# Patient Record
Sex: Female | Born: 1965 | Race: Black or African American | Hispanic: No | Marital: Single | Smoking: Never smoker
Health system: Southern US, Community
[De-identification: ages and names within clinical notes are randomized; demographics above are authoritative.]

## PROBLEM LIST (undated history)

## (undated) VITALS — BP 101/99 | HR 76 | Temp 97.8°F | Resp 20 | Ht 66.0 in | Wt 209.0 lb

## (undated) DIAGNOSIS — Z789 Other specified health status: Secondary | ICD-10-CM

## (undated) DIAGNOSIS — F329 Major depressive disorder, single episode, unspecified: Secondary | ICD-10-CM

## (undated) DIAGNOSIS — I10 Essential (primary) hypertension: Secondary | ICD-10-CM

## (undated) DIAGNOSIS — F32A Depression, unspecified: Secondary | ICD-10-CM

## (undated) DIAGNOSIS — F419 Anxiety disorder, unspecified: Secondary | ICD-10-CM

## (undated) HISTORY — PX: LEEP: SHX91

---

## 2000-10-26 ENCOUNTER — Emergency Department (HOSPITAL_COMMUNITY): Admission: EM | Admit: 2000-10-26 | Discharge: 2000-10-26 | Payer: Self-pay | Admitting: Emergency Medicine

## 2000-11-06 ENCOUNTER — Emergency Department (HOSPITAL_COMMUNITY): Admission: EM | Admit: 2000-11-06 | Discharge: 2000-11-06 | Payer: Self-pay | Admitting: *Deleted

## 2001-05-21 ENCOUNTER — Other Ambulatory Visit: Admission: RE | Admit: 2001-05-21 | Discharge: 2001-05-21 | Payer: Self-pay

## 2001-05-27 ENCOUNTER — Encounter: Payer: Self-pay | Admitting: Rheumatology

## 2001-05-27 ENCOUNTER — Encounter: Admission: RE | Admit: 2001-05-27 | Discharge: 2001-05-27 | Payer: Self-pay | Admitting: Rheumatology

## 2001-09-07 ENCOUNTER — Emergency Department (HOSPITAL_COMMUNITY): Admission: EM | Admit: 2001-09-07 | Discharge: 2001-09-07 | Payer: Self-pay | Admitting: *Deleted

## 2002-12-15 ENCOUNTER — Encounter: Admission: RE | Admit: 2002-12-15 | Discharge: 2002-12-15 | Payer: Self-pay

## 2006-08-17 ENCOUNTER — Other Ambulatory Visit: Admission: RE | Admit: 2006-08-17 | Discharge: 2006-08-17 | Payer: Self-pay | Admitting: *Deleted

## 2006-09-04 ENCOUNTER — Encounter: Admission: RE | Admit: 2006-09-04 | Discharge: 2006-09-04 | Payer: Self-pay | Admitting: *Deleted

## 2006-09-27 ENCOUNTER — Emergency Department (HOSPITAL_COMMUNITY): Admission: EM | Admit: 2006-09-27 | Discharge: 2006-09-27 | Payer: Self-pay | Admitting: *Deleted

## 2006-12-10 ENCOUNTER — Encounter: Admission: RE | Admit: 2006-12-10 | Discharge: 2006-12-10 | Payer: Self-pay | Admitting: *Deleted

## 2007-01-21 ENCOUNTER — Ambulatory Visit (HOSPITAL_COMMUNITY): Admission: RE | Admit: 2007-01-21 | Discharge: 2007-01-21 | Payer: Self-pay | Admitting: Obstetrics & Gynecology

## 2007-09-06 ENCOUNTER — Encounter: Admission: RE | Admit: 2007-09-06 | Discharge: 2007-09-06 | Payer: Self-pay | Admitting: Obstetrics & Gynecology

## 2007-10-16 ENCOUNTER — Ambulatory Visit (HOSPITAL_COMMUNITY): Admission: RE | Admit: 2007-10-16 | Discharge: 2007-10-16 | Payer: Self-pay | Admitting: Obstetrics & Gynecology

## 2009-09-04 ENCOUNTER — Emergency Department (HOSPITAL_COMMUNITY): Admission: EM | Admit: 2009-09-04 | Discharge: 2009-09-04 | Payer: Self-pay | Admitting: Emergency Medicine

## 2012-11-01 ENCOUNTER — Encounter: Payer: Self-pay | Admitting: Medical

## 2012-11-28 ENCOUNTER — Encounter: Payer: Self-pay | Admitting: Obstetrics & Gynecology

## 2012-12-18 ENCOUNTER — Encounter: Payer: Self-pay | Admitting: Obstetrics & Gynecology

## 2013-04-07 ENCOUNTER — Telehealth (HOSPITAL_COMMUNITY): Payer: Self-pay | Admitting: *Deleted

## 2013-04-07 NOTE — Telephone Encounter (Signed)
Patient contacted me to reschedule her appointment at the [facility] due to her menstrual cycle stopping 7 years ago. Patient has cancelled two appointments and no showed one appointment. Gave her the phone number to the Clinics to reschedule. Patient verbalized understanding and will call to reschedule.

## 2013-09-19 ENCOUNTER — Inpatient Hospital Stay (HOSPITAL_COMMUNITY)
Admission: AD | Admit: 2013-09-19 | Discharge: 2013-09-23 | DRG: 885 | Disposition: A | Discharge status: Home / Self Care | Payer: Federal, State, Local not specified - Other | Attending: Psychiatry | Admitting: Psychiatry

## 2013-09-19 ENCOUNTER — Encounter (HOSPITAL_COMMUNITY): Payer: Self-pay | Admitting: Licensed Clinical Social Worker

## 2013-09-19 DIAGNOSIS — F32A Depression, unspecified: Secondary | ICD-10-CM | POA: Diagnosis present

## 2013-09-19 DIAGNOSIS — R45851 Suicidal ideations: Secondary | ICD-10-CM

## 2013-09-19 DIAGNOSIS — F329 Major depressive disorder, single episode, unspecified: Principal | ICD-10-CM

## 2013-09-19 HISTORY — DX: Anxiety disorder, unspecified: F41.9

## 2013-09-19 HISTORY — DX: Major depressive disorder, single episode, unspecified: F32.9

## 2013-09-19 HISTORY — DX: Depression, unspecified: F32.A

## 2013-09-19 HISTORY — DX: Other specified health status: Z78.9

## 2013-09-19 MED ORDER — MAGNESIUM HYDROXIDE 400 MG/5ML PO SUSP: 30.0000 mL | Freq: Every day | ORAL | Status: DC | PRN

## 2013-09-19 MED ORDER — ALUM & MAG HYDROXIDE-SIMETH 200-200-20 MG/5ML PO SUSP: 30.0000 mL | ORAL | Status: DC | PRN

## 2013-09-19 MED ORDER — HYDROXYZINE HCL 50 MG PO TABS: 50.0000 mg | ORAL_TABLET | Freq: Every evening | ORAL | Status: DC | PRN
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
## 2013-09-19 NOTE — Progress Notes (Signed)
Report received from B. McNichols RN after admission. Writer met with patient shortly after she was oriented to the unit. She requested to use the phone before lying down and writer informed her of medication available if needed and she declined.  She is waiting to use the phone when it becomes available. She returned to her room to wait. She voiced no concerns currently. Safety maintained on unit with 15 min checks

## 2013-09-20 ENCOUNTER — Encounter (HOSPITAL_COMMUNITY): Payer: Self-pay | Admitting: *Deleted

## 2013-09-20 DIAGNOSIS — F329 Major depressive disorder, single episode, unspecified: Principal | ICD-10-CM | POA: Diagnosis present

## 2013-09-20 DIAGNOSIS — R45851 Suicidal ideations: Secondary | ICD-10-CM

## 2013-09-20 DIAGNOSIS — F332 Major depressive disorder, recurrent severe without psychotic features: Secondary | ICD-10-CM

## 2013-09-20 LAB — URINALYSIS, ROUTINE W REFLEX MICROSCOPIC
Bilirubin Urine: NEGATIVE
Glucose, UA: NEGATIVE mg/dL
Ketones, ur: NEGATIVE mg/dL
LEUKOCYTES UA: NEGATIVE
NITRITE: NEGATIVE
PH: 6.5 (ref 5.0–8.0)
Protein, ur: NEGATIVE mg/dL
SPECIFIC GRAVITY, URINE: 1.014 (ref 1.005–1.030)
Urobilinogen, UA: 0.2 mg/dL (ref 0.0–1.0)

## 2013-09-20 LAB — COMPREHENSIVE METABOLIC PANEL
ALBUMIN: 3.7 g/dL (ref 3.5–5.2)
ALT: 15 U/L (ref 0–35)
AST: 16 U/L (ref 0–37)
Alkaline Phosphatase: 103 U/L (ref 39–117)
BUN: 10 mg/dL (ref 6–23)
CO2: 29 mEq/L (ref 19–32)
CREATININE: 0.79 mg/dL (ref 0.50–1.10)
Calcium: 9.2 mg/dL (ref 8.4–10.5)
Chloride: 105 mEq/L (ref 96–112)
GFR calc Af Amer: >90 mL/min (ref >90)
GFR calc non Af Amer: >90 mL/min (ref >90)
Glucose, Bld: 90 mg/dL (ref 70–99)
POTASSIUM: 3.6 meq/L — AB (ref 3.7–5.3)
Sodium: 143 mEq/L (ref 137–147)
TOTAL PROTEIN: 7.9 g/dL (ref 6.0–8.3)
Total Bilirubin: 0.4 mg/dL (ref 0.3–1.2)

## 2013-09-20 LAB — CBC
HEMATOCRIT: 42 % (ref 36.0–46.0)
Hemoglobin: 13.7 g/dL (ref 12.0–15.0)
MCH: 30.9 pg (ref 26.0–34.0)
MCHC: 32.6 g/dL (ref 30.0–36.0)
MCV: 94.8 fL (ref 78.0–100.0)
Platelets: 226 10*3/uL (ref 150–400)
RBC: 4.43 MIL/uL (ref 3.87–5.11)
RDW: 12.9 % (ref 11.5–15.5)
WBC: 7 10*3/uL (ref 4.0–10.5)

## 2013-09-20 LAB — RAPID URINE DRUG SCREEN, HOSP PERFORMED
Amphetamines: NOT DETECTED
BARBITURATES: NOT DETECTED
Benzodiazepines: NOT DETECTED
COCAINE: NOT DETECTED
OPIATES: NOT DETECTED
Tetrahydrocannabinol: NOT DETECTED

## 2013-09-20 LAB — URINE MICROSCOPIC-ADD ON

## 2013-09-20 LAB — PREGNANCY, URINE: Preg Test, Ur: NEGATIVE

## 2013-09-20 LAB — TSH: TSH: 2.688 u[IU]/mL (ref 0.350–4.500)

## 2013-09-20 MED ORDER — FLUOXETINE HCL 20 MG PO CAPS
20.0000 mg | ORAL_CAPSULE | Freq: Every day | ORAL | Status: DC
  Filled 2013-09-20 (×4): qty 1

## 2013-09-20 MED ORDER — TRAZODONE HCL 50 MG PO TABS: 50.0000 mg | ORAL_TABLET | Freq: Every evening | ORAL | Status: DC | PRN

## 2013-09-20 NOTE — [facility] Suicide Risk Assessment (Signed)
   Nursing information obtained from:    Demographic factors:    Current Mental Status:    Loss Factors:    Historical Factors:    Risk Reduction Factors:    Total Time spent with patient: 20 minutes  CLINICAL FACTORS:   Severe Anxiety and/or Agitation Depression:   Anhedonia Hopelessness Impulsivity Insomnia  Psychiatric Specialty Exam: Physical Exam  Psychiatric: Her speech is normal and behavior is normal. Judgment normal. Her mood appears anxious. Cognition and memory are normal. She exhibits a depressed mood. She expresses suicidal ideation.    Review of Systems  Constitutional: Negative.   HENT: Negative.   Eyes: Negative.   Respiratory: Negative.   Cardiovascular: Negative.   Gastrointestinal: Negative.   Genitourinary: Negative.   Musculoskeletal: Negative.   Skin: Negative.   Neurological: Negative.   Endo/Heme/Allergies: Negative.   Psychiatric/Behavioral: Positive for depression and suicidal ideas. Negative for hallucinations and substance abuse. The patient is nervous/anxious and has insomnia.     Blood pressure 121/84, pulse 80, temperature 97.9 F (36.6 C), temperature source Oral, resp. rate 18, height 5\' 6"  (1.676 m), weight 94.802 kg (209 lb).Body mass index is 33.75 kg/(m^2).  General Appearance: Fairly Groomed  Patent attorneyye Contact::  Good  Speech:  Clear and Coherent and Normal Rate  Volume:  Decreased  Mood:  Depressed  Affect:  Restricted  Thought Process:  Goal Directed  Orientation:  Full (Time, Place, and Person)  Thought Content:  Negative  Suicidal Thoughts:  Yes.  without intent/plan  Homicidal Thoughts:  No  Memory:  Immediate;   Fair Recent;   Fair Remote;   Fair  Judgement:  Impaired  Insight:  Shallow  Psychomotor Activity:  Decreased  Concentration:  Fair  Recall:  FiservFair  Fund of Knowledge:Good  Language: Good  Akathisia:  No  Handed:  Right  AIMS (if indicated):     Assets:  Communication Skills Desire for Improvement Physical  Health  Sleep:  Number of Hours: 5   Musculoskeletal: Strength & Muscle Tone: within normal limits Gait & Station: normal Patient leans: N/A  COGNITIVE FEATURES THAT CONTRIBUTE TO RISK:  Closed-mindedness    SUICIDE RISK:   Minimal: No identifiable suicidal ideation.  Patients presenting with no risk factors but with morbid ruminations; may be classified as minimal risk based on the severity of the depressive symptoms  PLAN OF CARE:1. Admit for crisis management and stabilization. 2. Medication management to reduce current symptoms to base line and improve the     patient's overall level of functioning 3. Treat health problems as indicated. 4. Develop treatment plan to decrease risk of relapse upon discharge and the need for     readmission. 5. Psycho-social education regarding relapse prevention and self care. 6. Health care follow up as needed for medical problems. 7. Restart home medications where appropriate.   I certify that inpatient services furnished can reasonably be expected to improve the patient's condition.  Thedore MinsAkintayo, Dickie Labarre, MD 09/20/2013, 5:15 PM

## 2013-09-20 NOTE — Progress Notes (Addendum)
D) Pt has attended the groups and interacts with her peers appropriately. Rates her depression and hopelessness both at a 1. Admits to thoughts of SI during the last 24 hours,. A) Given support, reassurance, praise along with encouragement. R) continues to have thoughts of hurting herself. Contracts fo rher safety. Sad affect Depressed mood

## 2013-09-20 NOTE — Progress Notes (Signed)
Psychoeducational Group Note  Date: 09/20/2013 Time:  1015  Group Topic/Focus:  Identifying Needs:   The focus of this group is to help patients identify their personal needs that have been historically problematic and identify healthy behaviors to address their needs.  Participation Level:  Active  Participation Quality:  Appropriate  Affect:  Flat  Cognitive:  Appropriate  Insight:  improving  Engagement in Group:  Engaged  Additional Comments:    Pravin Perezperez A  
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
## 2013-09-20 NOTE — Progress Notes (Signed)
Patient has been up and active on the unit, attended group this evening and has voiced no complaints. Patient currently denies having pain, -si/hi/a/v hall. She reports her sister visited on today and she feels better, she report that she "just needed to vent." Support and encouragement offered, safety maintained on unit, will continue to monitor.

## 2013-09-20 NOTE — [facility] Group Notes (Signed)
[facility] Group Notes:  (Clinical Social Work)  09/20/2013   1:15-2:15PM  Summary of Progress/Problems:   The main focus of today's process group was for the patient to identify ways in which they have sabotaged their own mental health wellness/recovery.  Motivational interviewing and a handout were used to explore the benefits and costs of their self-sabotaging behavior as well as the benefits and costs of changing this behavior.  The Stages of Change were explained to the group using a handout, and patients identified where they are with regard to changing self-defeating behaviors.  The patient expressed she self-sabotages with negative self-talk and rumination in combination.  She feels that if she could change the negative self-talk, she could change all her maladaptive coping skills.  But this does mean she would have to vulnerable and face her fear that she may not always succeed.    Type of Therapy:  Process Group  Participation Level:  Active  Participation Quality:  Attentive, Sharing and Supportive  Affect:  Depressed  Cognitive:  Appropriate and Oriented  Insight:  Engaged  Engagement in Therapy:  Engaged  Modes of Intervention:  Education, Motivational Interviewing   Ambrose MantleMareida Grossman-Orr, LCSW 09/20/2013, 4:00pm

## 2013-09-20 NOTE — Progress Notes (Signed)
48 year old female pt admitted on voluntary basis. Pt reports on-going depression and having suicidal thoughts and reports on-going financial stress. Pt lives alone but has 2 daughters and 2 sisters that she counts as her support systems. Pt denies medical issues and reports that she is on no medication. Pt also reports that she does not want any medications while she is here. Pt reports that she begins a new job on Wednesday and hopes to be out so she can start work. Pt is able to contract for safety on the unit. Pt was oriented to unit and safety maintained.

## 2013-09-20 NOTE — Progress Notes (Signed)
.  Psychoeducational Group Note    Date: 09/20/2013 Time:  0930  Goal Setting Purpose of Group: To be able to set a goal that is measurable and that can be accomplished in one day Participation Level:  Active  Participation Quality:  Appropriate  Affect:  Appropriate  Cognitive:  Oriented  Insight:  Improving  Engagement in Group:  Engaged  Additional Comments:    Thereasa Iannello A 

## 2013-09-20 NOTE — Progress Notes (Signed)
Adult Psychoeducational Group Note  Date:  09/20/2013 Time:  6:59 PM  Group Topic/Focus:  Healthy Communication:   The focus of this group is to discuss communication, barriers to communication, as well as healthy ways to communicate with others.  Participation Level:  Active  Participation Quality:  Appropriate and Attentive  Affect:  Appropriate  Cognitive:  Appropriate  Insight: Appropriate  Engagement in Group:  Engaged  Modes of Intervention:  Activity, Discussion and Socialization    Alexis Hudson, Alexis Hudson 09/20/2013, 6:59 PM

## 2013-09-21 NOTE — Progress Notes (Addendum)
[facility] MD Progress Note  09/21/2013 8:31 AM Alexis Hudson  MRN:  [id number] Subjective:   Patient is calm and cooperative.  She tells me she slept well and has a good appetite.  She denies depressive or anxious symptoms.  She denies SI.  She has been attending groups and "getting something out of it".  We spoke to utilization of coping skills and is aware of her tendency to ruminate. She plans to attempt to replace rumination with positive thought list and today we spoke to the concept of daily positive affirmations.   She plans on dc to start her new job, take care of her pending court case issue (hopes to speak to social work about this) and "get back on track".   Diagnosis:   DSM5: Depressive Disorders:  Major Depressive Disorder - Severe (296.23) Total Time spent with patient: 15 minutes  Axis I: Major Depression, Recurrent severe Axis II: Deferred Axis III:  Past Medical History  Diagnosis Date  . Medical history non-contributory   . Depression   . Anxiety    Axis IV: economic problems, occupational problems, problems related to legal system/crime and problems with primary support group Axis V: 31-40 impairment in reality testing  ADL's:  Intact  Sleep: Good  Appetite:  Good  Suicidal Ideation:  -denies Homicidal Ideation:  -denies AEB (as evidenced by):  Psychiatric Specialty Exam: Physical Exam  ROS  Blood pressure 111/81, pulse 101, temperature 97.7 F (36.5 C), temperature source Oral, resp. rate 17, height 5' 6" (1.676 m), weight 94.802 kg (209 lb).Body mass index is 33.75 kg/(m^2).  General Appearance: Casual  Eye Contact::  Good  Speech:  Clear and Coherent  Volume:  Normal  Mood:  Depressed  Affect:  Congruent  Thought Process:  Negative  Orientation:  Full (Time, Place, and Person)  Thought Content:  Negative  Suicidal Thoughts:  No  Homicidal Thoughts:  No  Memory:  Immediate;   Good Recent;   Good Remote;   Good  Judgement:  Fair  Insight:  Fair   Psychomotor Activity:  Negative  Concentration:  Good  Recall:  Good  Fund of Knowledge:Good  Language: Good  Akathisia:  No  Handed:  Right  AIMS (if indicated):     Assets:  Communication Skills Desire for Improvement Physical Health Resilience Vocational/Educational  Sleep:  Number of Hours: 5   Musculoskeletal: Strength & Muscle Tone: within normal limits Gait & Station: normal Patient leans: N/A  Current Medications: Current Facility-Administered Medications  Medication Dose Route Frequency Provider Last Rate Last Dose  . alum & mag hydroxide-simeth (MAALOX/MYLANTA) 200-200-20 MG/5ML suspension 30 mL  30 mL Oral Q4H PRN Lurena Nida, NP      . FLUoxetine (PROZAC) capsule 20 mg  20 mg Oral Daily Itsel Opfer      . hydrOXYzine (ATARAX/VISTARIL) tablet 50 mg  50 mg Oral QHS PRN Lurena Nida, NP      . magnesium hydroxide (MILK OF MAGNESIA) suspension 30 mL  30 mL Oral Daily PRN Lurena Nida, NP      . traZODone (DESYREL) tablet 50 mg  50 mg Oral QHS PRN Rami Budhu        Lab Results:  Results for orders placed during the hospital encounter of 09/19/13 (from the past 48 hour(s))  URINALYSIS, ROUTINE W REFLEX MICROSCOPIC     Status: Abnormal   Collection Time    09/20/13  6:30 AM      Result Value Ref Range  Color, Urine YELLOW  YELLOW   APPearance CLEAR  CLEAR   Specific Gravity, Urine 1.014  1.005 - 1.030   pH 6.5  5.0 - 8.0   Glucose, UA NEGATIVE  NEGATIVE mg/dL   Hgb urine dipstick TRACE (*) NEGATIVE   Bilirubin Urine NEGATIVE  NEGATIVE   Ketones, ur NEGATIVE  NEGATIVE mg/dL   Protein, ur NEGATIVE  NEGATIVE mg/dL   Urobilinogen, UA 0.2  0.0 - 1.0 mg/dL   Nitrite NEGATIVE  NEGATIVE   Leukocytes, UA NEGATIVE  NEGATIVE   Comment: Performed at [place], URINE     Status: None   Collection Time    09/20/13  6:30 AM      Result Value Ref Range   Preg Test, Ur NEGATIVE  NEGATIVE   Comment:            THE SENSITIVITY  OF THIS     METHODOLOGY IS >20 mIU/mL.     Performed at [place]     Status: Abnormal   Collection Time    09/20/13  6:30 AM      Result Value Ref Range   Squamous Epithelial / LPF FEW (*) RARE   RBC / HPF 0-2  <3 RBC/hpf   Bacteria, UA FEW (*) RARE   Comment: Performed at [place] ([place])     Status: None   Collection Time    09/20/13  6:31 AM      Result Value Ref Range   Opiates NONE DETECTED  NONE DETECTED   Cocaine NONE DETECTED  NONE DETECTED   Benzodiazepines NONE DETECTED  NONE DETECTED   Amphetamines NONE DETECTED  NONE DETECTED   Tetrahydrocannabinol NONE DETECTED  NONE DETECTED   Barbiturates NONE DETECTED  NONE DETECTED   Comment:            DRUG SCREEN FOR MEDICAL PURPOSES     ONLY.  IF CONFIRMATION IS NEEDED     FOR ANY PURPOSE, NOTIFY LAB     WITHIN 5 DAYS.                LOWEST DETECTABLE LIMITS     FOR URINE DRUG SCREEN     Drug Class       Cutoff (ng/mL)     Amphetamine      1000     Barbiturate      200     Benzodiazepine   353     Tricyclics       614     Opiates          300     Cocaine          300     THC              50     Performed at [facility]  CBC     Status: None   Collection Time    09/20/13  6:36 AM      Result Value Ref Range   WBC 7.0  4.0 - 10.5 K/uL   RBC 4.43  3.87 - 5.11 MIL/uL   Hemoglobin 13.7  12.0 - 15.0 g/dL   HCT 42.0  36.0 - 46.0 %   MCV 94.8  78.0 - 100.0 fL   MCH 30.9  26.0 - 34.0 pg   MCHC 32.6  30.0 - 36.0 g/dL   RDW 12.9  11.5 - 15.5 %   Platelets 226  150 - 400 K/uL   Comment: Performed at [place] PANEL     Status: Abnormal   Collection Time    09/20/13  6:36 AM      Result Value Ref Range   Sodium 143  137 - 147 mEq/L   Potassium 3.6 (*) 3.7 - 5.3 mEq/L   Chloride 105  96 - 112 mEq/L   CO2 29  19 - 32 mEq/L   Glucose, Bld 90  70 - 99  mg/dL   BUN 10  6 - 23 mg/dL   Creatinine, Ser 0.79  0.50 - 1.10 mg/dL   Calcium 9.2  8.4 - 10.5 mg/dL   Total Protein 7.9  6.0 - 8.3 g/dL   Albumin 3.7  3.5 - 5.2 g/dL   AST 16  0 - 37 U/L   ALT 15  0 - 35 U/L   Alkaline Phosphatase 103  39 - 117 U/L   Total Bilirubin 0.4  0.3 - 1.2 mg/dL   GFR calc non Af Amer >90  >90 mL/min   GFR calc Af Amer >90  >90 mL/min   Comment: (NOTE)     The eGFR has been calculated using the CKD EPI equation.     This calculation has not been validated in all clinical situations.     eGFR's persistently <90 mL/min signify possible Chronic Kidney     Disease.     Performed at [facility]  TSH     Status: None   Collection Time    09/20/13  6:36 AM      Result Value Ref Range   TSH 2.688  0.350 - 4.500 uIU/mL   Comment: Performed at [facility]    Physical Findings: AIMS: Facial and Oral Movements Muscles of Facial Expression: None, normal Lips and Perioral Area: None, normal Jaw: None, normal Tongue: None, normal,Extremity Movements Upper (arms, wrists, hands, fingers): None, normal Lower (legs, knees, ankles, toes): None, normal, Trunk Movements Neck, shoulders, hips: None, normal, Overall Severity Severity of abnormal movements (highest score from questions above): None, normal Incapacitation due to abnormal movements: None, normal Patient's awareness of abnormal movements (rate only patient's report): No Awareness, Dental Status Current problems with teeth and/or dentures?: No Does patient usually wear dentures?: No  CIWA:    COWS:     Treatment Plan Summary: Daily contact with patient to assess and evaluate symptoms and progress in treatment Medication management  Plan:  Medical Decision Making Problem Points:  Established problem, stable/improving (1) Data Points:  Review of medication regiment & side effects (2)                        Discussed coping skills and utilization   I certify that inpatient  services furnished can reasonably be expected to improve the patient's condition.   [place], Time 09/21/2013, 8:31 AM  Patient seen, evaluated and I agree with notes by Nurse Practitioner. Corena Pilgrim, MD

## 2013-09-21 NOTE — [facility] Group Notes (Signed)
Psychoeducational Group Note  Date:  09/21/2013 Time:  0930  Group Topic/Focus:  Making Healthy Choices:   The focus of this group is to help patients identify negative/unhealthy choices they were using prior to admission and identify positive/healthier coping strategies to replace them upon discharge.  Participation Level: active  Participation Quality:  Appropriate  Affect: appropriate  Cognitive: oriented  Insight: improving  Engagement in Group: engaged  Additional Comments:   Jule SerKent, Vrinda Heckstall [place] 09/21/2013, 11:20 AM

## 2013-09-21 NOTE — Progress Notes (Signed)
Writer spoke with patient 1:1 and she reports having had a good day and has voiced no concerns. She reports that the groups have been helpful and is looking forward to discharge. She still has not requested or wanted any medications since being admitted. She currently denies si/hi/a/v hallucinations. Support and encouragement given, safety maintained on unit with 15 min checks. Writer has observed patient up in dayroom briefly and attended group but normally is isolative to her room.

## 2013-09-21 NOTE — Progress Notes (Signed)
Adult Psychoeducational Group Note  Date:  09/21/2013 Time:  9:44 PM  Group Topic/Focus:  Wrap-Up Group:   The focus of this group is to help patients review their daily goal of treatment and discuss progress on daily workbooks.  Participation Level:  Active  Participation Quality:  Appropriate  Affect:  Appropriate  Cognitive:  Appropriate  Insight: Appropriate  Engagement in Group:  Engaged  Modes of Intervention:  Discussion  Additional Comments:  The patient expressed that she learned from group to have enough people in you support group.  Octavio Mannshigpen, Taysha Majewski [place] 09/21/2013, 9:44 PM

## 2013-09-21 NOTE — Progress Notes (Signed)
Psychoeducational Group Note  Date:  09/21/2013 Time:  1015  Group Topic/Focus:  Making Healthy Choices:   The focus of this group is to help patients identify negative/unhealthy choices they were using prior to admission and identify positive/healthier coping strategies to replace them upon discharge.  Participation Level:  Active  Participation Quality:  Appropriate  Affect:  Flat  Cognitive:  Oriented  Insight:  Improving  Engagement in Group:  Engaged  Additional Comments:    Joscelyn Hardrick A 09/21/2013 

## 2013-09-21 NOTE — Progress Notes (Signed)
[facility] Group Notes:  (Nursing/MHT/Case Management/Adjunct)  Date:  09/21/2013  Time:  12:07 AM  Type of Therapy:  Group Therapy  Participation Level:  Minimal  Participation Quality:  Appropriate  Affect:  Appropriate  Cognitive:  Appropriate  Insight:  Appropriate  Engagement in Group:  Engaged  Modes of Intervention:  Socialization and Support  Summary of Progress/Problems: Pt. Rated her energy level as a 9.  Pt. Stated she was admitted because of SI.  Pt. Stated she negative for SI and plans to replace negative thoughts with positive thoughts as a healthy behavior.  Sondra ComeWilson, Jiovany Scheffel J 09/21/2013, 12:07 AM

## 2013-09-21 NOTE — [facility] Group Notes (Signed)
[facility] Group Notes:  (Clinical Social Work)  09/21/2013   3-4PM  Summary of Progress/Problems:  The main focus of today's process group was to identify the patient's current support system and decide on other supports that can be put in place.  The illustration of a 4-legged chair was used to discuss why additional supports are needed.  An emphasis was placed on using counselor, doctor, therapy groups, 12-step groups, and problem-specific support groups to expand supports.   There was also an extensive discussion about what constitutes a healthy support versus an unhealthy support.  The patient expressed comprehension of the concepts presented.  She asked the group how she can develop trust for supports when she has such a difficult time trusting people, and demonstrated insight when it was pointed out to her that within less than 48 hours in this hospital, she was trusting people who had previously been complete strangers.  Type of Therapy:  Process Group  Participation Level:  Active  Participation Quality:  Attentive and Sharing  Affect:  Blunted and Depressed  Cognitive:  Appropriate and Oriented  Insight:  Engaged  Engagement in Therapy:  Engaged  Modes of Intervention:  Education,  Support and [employer] FoodsProcessing  Kamariyah Timberlake Grossman-Orr, LCSW 09/21/2013, 4:00pm

## 2013-09-21 NOTE — [facility] Counselor (Signed)
Adult Comprehensive Assessment  Patient ID: Alexis Hudson, female   DOB: 11/11/1965, 48 y.o.   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  She has had limited success doing so. Financial / Lack of resources (include bankruptcy): Pt has limited financial resources. Social relationships: Pt has a court date on 3/16. For charge defrauding an in Biomedical engineerkeeper.  Living/Environment/Situation:  Living Arrangements: Alone Living conditions (as described by patient or guardian): Pt lives alone in a town home.  She describes the neighborhood as peaceful and friendly. How long has patient lived in current situation?: 2 years What is atmosphere in current home: Comfortable;Loving  Family History:  Marital status: Divorced Divorced, when?: 2000 What types of issues is patient dealing with in the relationship?: Pt does not have contact with ex-husband however, she states that she struggles with regrets over the relationship.  She reports wishing that she had left sooner Does patient have children?: Yes How many children?: 2 How is patient's relationship with their children?: Pt reports relationship with oldest daughter is strained. Pt maintains close relationship with youngest daughter.  Childhood History:  By whom was/is the patient raised?: Both parents Description of patient's relationship with caregiver when they were a child: Pt shares that she agitated close relationships with her parents during her childhood Patient's description of current relationship with people who raised him/her: Deceased Does patient have siblings?: Yes Number of Siblings: 2 Description of patient's current relationship with siblings: Close and loving Did patient suffer any verbal/emotional/physical/sexual abuse as a child?: No Did patient suffer from severe childhood neglect?: No Has patient  ever been sexually abused/assaulted/raped as an adolescent or adult?: No Was the patient ever a victim of a crime or a disaster?: No Witnessed domestic violence?: No Has patient been effected by domestic violence as an adult?: No  Education:  Highest grade of school patient has completed: Pt has a Event organiserMasters degree in Conflict Resolution Currently a student?: No Name of school: NA Contact person: NA Learning disability?: No  Employment/Work Situation:   Employment situation: Unemployed Patient's job has been impacted by current illness: No What is the longest time patient has a held a job?: 3-4 years 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 Has patient ever been in the [employer] and Companymilitary?: No Has patient ever served in Buyer, retailcombat?: No  Financial Resources:   Financial resources: Income from employment Does patient have a representative payee or guardian?: No  Alcohol/Substance Abuse:   What has been your use of drugs/alcohol within the last 12 months?: Pt denies If attempted suicide, did drugs/alcohol play a role in this?: No Alcohol/Substance Abuse Treatment Hx: Denies past history If yes, describe treatment: NA Has alcohol/substance abuse ever caused legal problems?: No  Social Support System:   Patient's Community Support System: Fair Describe Community Support System: Pt describes her support system as "lean".  Pt identifies her sisters as her primary supports Type of faith/religion: Ephriam KnucklesChristian How does patient's faith help to cope with current illness?: Prayer, medication, and reciting scriptures  Leisure/Recreation:   Leisure and Hobbies: Reading and planning trips  Strengths/Needs:   What things does the patient do well?: Pt reports that she is a good caregiver, that she is organized, and she writes well. In what areas does patient struggle / problems for patient: Pt states that struggles with articulating her thoughts verbally and asking for  help.  Discharge  Plan:   Does patient have access to transportation?: Yes Will patient be returning to same living situation after discharge?: Yes Currently receiving community mental health services: No If no, would patient like referral for services when discharged?: Yes (What county?) [facility]) Does patient have financial barriers related to discharge medications?: No  Summary/Recommendations:    PAW KARSTENS is an 48 y.o. female, divorced, African-American who presents to [facility] [place] accompanied by friend and "peer support", Alexis Hudson [phone] who participated in assessment at Pt's request. Pt reports she came to [facility] because she is experiencing suicidal ideation and "I decided that Sunday would be the day." Pt states she has been researching ways to commit suicide online and that she was considering overdosing on pills. Pt states that she has a court date Monday, September 22, 2013 for defrauding a hotel and that she "would rather die than experience the embarrassment of going to jail." She reports symptoms including decreased concentration, irritability and anxiety. She also states she feels like people may be recording her telephone conversations due to her legal problems. Pt lives alone and states she has two sisters who are supportive and also has two children, ages 33 and 29. Pt denies any history of inpatient or outpatient mental health or substance abuse treatment. Pt is neatly dressed, alert, oriented x4 and appears stated age. She has good eye contact. Speech is normal in volume, rate and rhythm. Mood is anxious and affect is blunted. Thoughts are coherent and goal directed. Patient does not appear to be responding to internal stimuli or having delusional thought process at this time. Insight and judgment are both fair. Pt was cooperative throughout assessment.   Recommendations: Pt admits to hospital for suicidal ideation she will benefit from medication management, psycho education, group therapy,  and aftercare planning for appropriate follow up care.    Lacorey Brusca. 09/21/2013

## 2013-09-21 NOTE — Progress Notes (Signed)
D) Pt has attended the groups and asked questions about how does one go about stopping ruminating thoughts that hurt her. Rates her depression and hopelessness both at a 1 and denies SI and HI. Pt refused her antidepressant today stating that she wanted a list of all the possibilities of medications that she could take, along with their side effects before she was going to take any medications. Stay to herself a lot or will get on the phone. Affect remains flat and mood depressed A) Pt provided with a short 1:1. Given support and encouraged to think about taking the medications.  R) Pt states she is fearful of the medication and wants to wait to talk with the doctor.

## 2013-09-22 NOTE — [facility] Group Notes (Signed)
.  [facility] LCSW Group Therapy          Overcoming Obstacles       1:15 -2:30        09/22/2013       Type of Therapy:  Group Therapy  Participation Level:  Appropriate  Participation Quality:  Appropriate  Affect:  Appropriate, Alert  Cognitive:  Attentive Appropriate  Insight: Developing/Improving Engaged  Engagement in Therapy: Developing/Imprvoing Engaged  Modes of Intervention:  Discussion Exploration  Education Rapport BuildingProblem-Solving Support  Summary of Progress/Problems:  The main focus of today's group was overcoming obstacles.  Patient shared the obstacle she has to overcome is mishandling her finances and taking better care of her health.  Patient able to identify appropriate coping skills.   Wynn BankerHodnett, Michalle Rademaker Hairston 09/22/2013

## 2013-09-22 NOTE — Progress Notes (Signed)
Patient ID: Alexis Hudson, female   DOB: [birth year], 48 y.o.   MRN: [id number] D-Patient reports her sleep was only fair due to new roommate's snoring.  Her appetite is good but she did skip breakfast.  She reports her energy level is normal and her ability to pay attention is good.  She denies feeling depressed or anxious.  Patient requested information on prozac, saying she wants to review information before taking it.  A- Gave patient information on prozac and gave her a copy of her 72 hour notice at her request.  R- Patient says she decided she doesn't want to take prozac after reading information,  Was concerned about becoming dependent on it and concerned that it cannot be stopped abruptly.  Talked with patient about benefits of medication and how meds can be slowly decreased to prevent discontinuation syndrome.  Suggested patient talk with MD further about antidepressants.

## 2013-09-22 NOTE — Progress Notes (Signed)
Patient appeared bright on approach at the beginning of this shift. Mood and affects  Appropriate. She endorsed having a good day, denied SI/HI and denied hallucinations.  She has been attending group and stated her goal after discharge is to use the coping skills learnt here and also [facility]. Writer encouraged and supported patient. Q 15 minute checks continues as ordered to maintain safety.

## 2013-09-22 NOTE — Progress Notes (Signed)
Pt attended group. Says she overcame obstacles plans to get her finances in order

## 2013-09-22 NOTE — Progress Notes (Signed)
Patient ID: Alexis Hudson, female   DOB: Dec 01, 1965, 48 y.o.   MR[facility]: [id number] [facility]  09/22/2013 2:08 PM Alexis Hudson  MRN:  [id number]  Subjective:  Patient was seen and chart reviewed. Patient was diagnosed with major depressive disorder single episode. Patient has been receiving inpatient psychiatric hospitalization including medication management and therapies. Patient reported she has a multiple psychosocial stressors especially court cases regarding larceny charges, financial problems, lack of support from the family making her more depressed and he like her life is not worth living. Patient has been minimizing her symptoms of depression anxiety during this evaluation. Patient contracts for safety while in the hospital.  We spoke to utilization of coping skills and is aware of her tendency to ruminate. She plans to attempt to replace rumination with positive thought list and today we spoke to the concept of daily positive affirmations. She plans on start her new job, take care of her pending court case issue and has signed the 72 hours form requests to be discharged.    Diagnosis:   DSM5: Depressive Disorders:  Major Depressive Disorder - Severe (296.23) Total Time spent with patient: 15 minutes  Axis I: Major Depression, Recurrent severe Axis II: Deferred Axis III:  Past Medical History  Diagnosis Date  . Medical history non-contributory   . Depression   . Anxiety    Axis IV: economic problems, occupational problems, problems related to legal system/crime and problems with primary support group Axis V: 31-40 impairment in reality testing  ADL's:  Intact  Sleep: Good  Appetite:  Good  Suicidal Ideation:  -denies Homicidal Ideation:  -denies AEB (as evidenced by):  Psychiatric Specialty Exam: Physical Exam  ROS  Blood pressure 136/94, pulse 72, temperature 97.6 F (36.4 C), temperature source Oral, resp. rate 20, height 5\' 6"  (1.676 m), weight 94.802 kg  (209 lb).Body mass index is 33.75 kg/(m^2).  General Appearance: Casual  Eye Contact::  Good  Speech:  Clear and Coherent  Volume:  Normal  Mood:  Depressed  Affect:  Congruent  Thought Process:  Negative  Orientation:  Full (Time, Place, and Person)  Thought Content:  Negative  Suicidal Thoughts:  No  Homicidal Thoughts:  No  Memory:  Immediate;   Good Recent;   Good Remote;   Good  Judgement:  Fair  Insight:  Fair  Psychomotor Activity:  Negative  Concentration:  Good  Recall:  Good  Fund of Knowledge:Good  Language: Good  Akathisia:  No  Handed:  Right  AIMS (if indicated):     Assets:  Communication Skills Desire for Improvement Physical Health Resilience Vocational/Educational  Sleep:  Number of Hours: 5   Musculoskeletal: Strength & Muscle Tone: within normal limits Gait & Station: normal Patient leans: N/A  Current Medications: Current Facility-Administered Medications  Medication Dose Route Frequency Provider Last Rate Last Dose  . alum & mag hydroxide-simeth (MAALOX/MYLANTA) 200-200-20 MG/5ML suspension 30 mL  30 mL Oral Q4H PRN Kristeen MansFran E Hobson, NP      . FLUoxetine (PROZAC) capsule 20 mg  20 mg Oral Daily Mojeed Akintayo      . hydrOXYzine (ATARAX/VISTARIL) tablet 50 mg  50 mg Oral QHS PRN Kristeen MansFran E Hobson, NP      . magnesium hydroxide (MILK OF MAGNESIA) suspension 30 mL  30 mL Oral Daily PRN Kristeen MansFran E Hobson, NP      . traZODone (DESYREL) tablet 50 mg  50 mg Oral QHS PRN Mojeed Akintayo  Lab Results:  No results found for this or any previous visit (from the past 48 hour(s)).  Physical Findings: AIMS: Facial and Oral Movements Muscles of Facial Expression: None, normal Lips and Perioral Area: None, normal Jaw: None, normal Tongue: None, normal,Extremity Movements Upper (arms, wrists, hands, fingers): None, normal Lower (legs, knees, ankles, toes): None, normal, Trunk Movements Neck, shoulders, hips: None, normal, Overall Severity Severity of  abnormal movements (highest score from questions above): None, normal Incapacitation due to abnormal movements: None, normal Patient's awareness of abnormal movements (rate only patient's report): No Awareness, Dental Status Current problems with teeth and/or dentures?: No Does patient usually wear dentures?: No  CIWA:    COWS:     Treatment Plan Summary: Daily contact with patient to assess and evaluate symptoms and progress in treatment Medication management  Plan: Continue current treatment plan and medication management Encourage active unit participation in therapies and learn coping skills  Disposition plans are in progress, made the chest tomorrow to continue to contract for safety and show clinical improvement in her depression.  Medical Decision Making Problem Points:  Established problem, stable/improving (1) Data Points:  Review of medication regiment & side effects (2)                        Discussed coping skills and utilization   I certify that inpatient services furnished can reasonably be expected to improve the patient's condition.   Rochell Mabie,JANARDHAHA R.  09/22/2013, 2:08 PM

## 2013-09-22 NOTE — [facility] Suicide Risk Assessment (Signed)
[facility] INPATIENT:  Family/Significant Other Suicide Prevention Education  Suicide Prevention Education:  Education Complete d; Wynona MealsCheryl Herring, Sister, [phone]; has been identified by the patient as the family member/significant other with whom the patient will be residing, and identified as the person(s) who will aid the patient in the event of a mental health crisis (suicidal ideations/suicide attempt).  With written consent from the patient, the family member/significant other has been provided the following suicide prevention education, prior to the and/or following the discharge of the patient.  The suicide prevention education provided includes the following:  Suicide risk factors  Suicide prevention and interventions  National Suicide Hotline telephone number  [facility] assessment telephone number  [facility] Emergency Assistance 911  [facility] and/or Residential Mobile Crisis Unit telephone number  Request made of family/significant other to:  Remove weapons (e.g., guns, rifles, knives), all items previously/currently identified as safety concern.  Sister advised patient does not have access to guns.   Remove drugs/medications (over-the-counter, prescriptions, illicit drugs), all items previously/currently identified as a safety concern.  The family member/significant other verbalizes understanding of the suicide prevention education information provided.  The family member/significant other agrees to remove the items of safety concern listed above.  Brennley Curtice, Joesph JulyQuylle Hairston

## 2013-09-22 NOTE — Tx Team (Signed)
Interdisciplinary Treatment Plan Update   Date Reviewed:  09/22/2013  Time Reviewed:  9:41 AM  Progress in Treatment:   Attending groups: Yes Participating in groups: Yes Taking medication as prescribed: Yes  Tolerating medication: Yes Family/Significant other contact made:  No, but will ask patient for consent for collateral contact Patient understands diagnosis: Yes  Discussing patient identified problems/goals with staff: Yes Medical problems stabilized or resolved: Yes Denies suicidal/homicidal ideation: Yes Patient has not harmed self or others: Yes  For review of initial/current patient goals, please see plan of care.  Estimated Length of Stay:  1 day  Reasons for Continued Hospitalization:  Anxiety Depression Medication stabilization  New Problems/Goals identified:    Discharge Plan or Barriers:   Home with outpatient follow up to be determined  Additional Comments:  Alexis Hudson is an 48 y.o. female, divorced, African-American [facility] by friend and "peer support", Brett FairyCarina Cole [phone] who participated in assessment at Pt's request. Pt [facility] she is experiencing suicidal ideation and "I decided that Sunday would be the day." Pt states she has been researching ways to commit suicide online and that she was considering overdosing on pills. Pt states that she has a court date Monday, September 22, 2013 for defrauding a hotel and that she "would rather die than experience the embarrassment of going to jail." She denies any history of previous suicidal gestures. She denies any intentional self-injurious behavior. She denies any family history of suicide. Pt denies access to firearms.    Attendees:  Patient:  09/22/2013 9:41 AM   Signature: Mervyn GayJ. Jonnalagadda, MD 09/22/2013 9:41 AM  Signature:   09/22/2013 9:41 AM  Signature:  Claudette Headonrad Withrow, NP 09/22/2013 9:41 AM  Signature:Beverly Terrilee CroakKnight, RN 09/22/2013 9:41 AM  Signature:   Neill Loftarol Davis RN 09/22/2013 9:41 AM  Signature:  Juline PatchQuylle Hazelyn Kallen, LCSW 09/22/2013 9:41 AM  Signature:  Reyes Ivanhelsea Horton, LCSW 09/22/2013 9:41 AM  Signature:  Leisa LenzValerie Enoch, Care Coordinator [facility] 09/22/2013 9:41 AM  Signature:  Aloha GellKrista Dopson, RN 09/22/2013 9:41 AM  Signature: Leighton ParodyBritney Tyson, RN 09/22/2013  9:41 AM  Signature:   Onnie BoerJennifer Clark, RN [facility] 09/22/2013  9:41 AM  Signature:  Harold Barbanonecia Byrd, RN 09/22/2013  9:41 AM    Scribe for Treatment Team:   Juline PatchQuylle Elzina Devera,  09/22/2013 9:41 AM

## 2013-09-23 DIAGNOSIS — F329 Major depressive disorder, single episode, unspecified: Principal | ICD-10-CM

## 2013-09-23 MED ORDER — FLUOXETINE HCL 20 MG PO CAPS: 20.0000 mg | ORAL_CAPSULE | Freq: Every day | ORAL | Status: DC

## 2013-09-23 NOTE — Progress Notes (Signed)
[facility] :  Will you be returning to the same living situation after discharge: Yes,  Patient is returning to her home At discharge, do you have transportation home?:Yes,  Patient to arrange transportation home. Do you have the ability to pay for your medications.No, pPatient needs assistance with indigent medications   Release of information consent forms completed and in the chart;  Patient's signature needed at discharge.  Patient to Follow up at: Follow-up Information   [facility]. (Please go to [facility]'s walk in clinic on Wednesday, September 24, 2013 or any weekday between 8AM - 12:00 Noon or 1:00PM - 3 PM )    Contact information:   [address] [place]   [id number]  [phone]      Patient denies SI/HI: Patient no longer endorsing SI/HI or other thoughts of self harm.  Safety Planning and Suicide Prevention discussed: .Reviewed with all patients during discharge planning group  Rainn Bullinger, Joesph JulyQuylle Hairston 09/23/2013, 8:37 AM

## 2013-09-23 NOTE — Progress Notes (Signed)
Patient ID: Alexis Hudson, female   DOB: 11/11/1965, 48 y.o.   MRN: [id number] Patient is discharged ambulatory to ride home with sister.  She denies SI/HI.  She verbalizes understanding of he discharge meds and followup.  She did not take prozac while here but she does have information about it.  Patient is optimistic about the future,
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
## 2013-09-26 NOTE — Progress Notes (Signed)
Patient Discharge Instructions:  After Visit Summary (AVS):   Faxed to:  09/26/13 Discharge Summary Note:   Faxed to:  09/26/13 Psychiatric Admission Assessment Note:   Faxed to:  09/26/13 Suicide Risk Assessment - Discharge Assessment:   Faxed to:  09/26/13 Faxed/Sent to the Next Level Care provider:  09/26/13 Faxed to [facility] @ [phone]  Jerelene ReddenSheena E Bridge[place], 09/26/2013, 2:40 PM

## 2015-01-22 ENCOUNTER — Encounter (HOSPITAL_COMMUNITY): Payer: Self-pay

## 2015-01-22 ENCOUNTER — Inpatient Hospital Stay (HOSPITAL_COMMUNITY)
Admission: AD | Admit: 2015-01-22 | Discharge: 2015-01-22 | Disposition: A | Discharge status: Home / Self Care | Payer: Self-pay | Source: Ambulatory Visit | Attending: Family Medicine | Admitting: Family Medicine

## 2015-01-22 DIAGNOSIS — I10 Essential (primary) hypertension: Secondary | ICD-10-CM | POA: Insufficient documentation

## 2015-01-22 DIAGNOSIS — R55 Syncope and collapse: Secondary | ICD-10-CM | POA: Insufficient documentation

## 2015-01-22 HISTORY — DX: Essential (primary) hypertension: I10

## 2015-01-22 LAB — POCT PREGNANCY, URINE: PREG TEST UR: NEGATIVE

## 2015-01-22 NOTE — Discharge Instructions (Signed)
Hypertension °Hypertension, commonly called high blood pressure, is when the force of blood pumping through your arteries is too strong. Your arteries are the blood vessels that carry blood from your heart throughout your body. A blood pressure reading consists of a higher number over a lower number, such as 110/72. The higher number (systolic) is the pressure inside your arteries when your heart pumps. The lower number (diastolic) is the pressure inside your arteries when your heart relaxes. Ideally you want your blood pressure below 120/80. °Hypertension forces your heart to work harder to pump blood. Your arteries may become narrow or stiff. Having hypertension puts you at risk for heart disease, stroke, and other problems.  °RISK FACTORS °Some risk factors for high blood pressure are controllable. Others are not.  °Risk factors you cannot control include:  °· Race. You may be at higher risk if you are African American. °· Age. Risk increases with age. °· Gender. Men are at higher risk than women before age 45 years. After age 65, women are at higher risk than men. °Risk factors you can control include: °· Not getting enough exercise or physical activity. °· Being overweight. °· Getting too much fat, sugar, calories, or salt in your diet. °· Drinking too much alcohol. °SIGNS AND SYMPTOMS °Hypertension does not usually cause signs or symptoms. Extremely high blood pressure (hypertensive crisis) may cause headache, anxiety, shortness of breath, and nosebleed. °DIAGNOSIS  °To check if you have hypertension, your health care provider will measure your blood pressure while you are seated, with your arm held at the level of your heart. It should be measured at least twice using the same arm. Certain conditions can cause a difference in blood pressure between your right and left arms. A blood pressure reading that is higher than normal on one occasion does not mean that you need treatment. If one blood pressure reading  is high, ask your health care provider about having it checked again. °TREATMENT  °Treating high blood pressure includes making lifestyle changes and possibly taking medicine. Living a healthy lifestyle can help lower high blood pressure. You may need to change some of your habits. °Lifestyle changes may include: °· Following the DASH diet. This diet is high in fruits, vegetables, and whole grains. It is low in salt, red meat, and added sugars. °· Getting at least 2½ hours of brisk physical activity every week. °· Losing weight if necessary. °· Not smoking. °· Limiting alcoholic beverages. °· Learning ways to reduce stress. ° If lifestyle changes are not enough to get your blood pressure under control, your health care provider may prescribe medicine. You may need to take more than one. Work closely with your health care provider to understand the risks and benefits. °HOME CARE INSTRUCTIONS °· Have your blood pressure rechecked as directed by your health care provider.   °· Take medicines only as directed by your health care provider. Follow the directions carefully. Blood pressure medicines must be taken as prescribed. The medicine does not work as well when you skip doses. Skipping doses also puts you at risk for problems.   °· Do not smoke.   °· Monitor your blood pressure at home as directed by your health care provider.  °SEEK MEDICAL CARE IF:  °· You think you are having a reaction to medicines taken. °· You have recurrent headaches or feel dizzy. °· You have swelling in your ankles. °· You have trouble with your vision. °SEEK IMMEDIATE MEDICAL CARE IF: °· You develop a severe headache or confusion. °·   You have unusual weakness, numbness, or feel faint. °· You have severe chest or abdominal pain. °· You vomit repeatedly. °· You have trouble breathing. °MAKE SURE YOU:  °· Understand these instructions. °· Will watch your condition. °· Will get help right away if you are not doing well or get worse. °Document  Released: 06/26/2005 Document Revised: 11/10/2013 Document Reviewed: 04/18/2013 °ExitCare® Patient Information ©2015 [employer]. This information is not intended to replace advice given to you by your health care provider. Make sure you discuss any questions you have with your health care provider. ° °Near-Syncope °Near-syncope (commonly known as near fainting) is sudden weakness, dizziness, or feeling like you might pass out. During an episode of near-syncope, you may also develop pale skin, have tunnel vision, or feel sick to your stomach (nauseous). Near-syncope may occur when getting up after sitting or while standing for a long time. It is caused by a sudden decrease in blood flow to the brain. This decrease can result from various causes or triggers, most of which are not serious. However, because near-syncope can sometimes be a sign of something serious, a medical evaluation is required. The specific cause is often not determined. °HOME CARE INSTRUCTIONS  °Monitor your condition for any changes. The following actions may help to alleviate any discomfort you are experiencing: °· Have someone stay with you until you feel stable. °· Lie down right away and prop your feet up if you start feeling like you might faint. Breathe deeply and steadily. Wait until all the symptoms have passed. Most of these episodes last only a few minutes. You may feel tired for several hours.   °· Drink enough fluids to keep your urine clear or pale yellow.   °· If you are taking blood pressure or heart medicine, get up slowly when seated or lying down. Take several minutes to sit and then stand. This can reduce dizziness. °· Follow up with your health care provider as directed.  °SEEK IMMEDIATE MEDICAL CARE IF:  °· You have a severe headache.   °· You have unusual pain in the chest, abdomen, or back.   °· You are bleeding from the mouth or rectum, or you have black or tarry stool.   °· You have an irregular or very fast heartbeat.    °· You have repeated fainting or have seizure-like jerking during an episode.   °· You faint when sitting or lying down.   °· You have confusion.   °· You have difficulty walking.   °· You have severe weakness.   °· You have vision problems.   °MAKE SURE YOU:  °· Understand these instructions. °· Will watch your condition. °· Will get help right away if you are not doing well or get worse. °Document Released: 06/26/2005 Document Revised: 07/01/2013 Document Reviewed: 11/29/2012 °ExitCare® Patient Information ©2015 [employer]. This information is not intended to replace advice given to you by your health care provider. Make sure you discuss any questions you have with your health care provider. ° °

## 2015-01-22 NOTE — MAU Note (Signed)
Pt states went to store and felt faint. Sat down and ate something and still didn't feel right. Dx'd with HTN, however is out of meds. No pain.

## 2015-01-22 NOTE — Progress Notes (Signed)
CSW contacted by MAU due to patient requesting information about how to establish a PCP.   CSW met with patient in order to explore her current needs.  Patient reported that she is currently working, but cannot afford health insurance.  She stated that she is interested in establishing a PCP, but is unsure how to find an affordable provider.  CSW provided patient with list of clinics, and encouraged patient to choose a clinic and then contact them to establish an appointment.  Patient denied additional questions, concerns, or needs at this time.

## 2015-01-22 NOTE — MAU Note (Signed)
Urine sent to lab 

## 2015-01-22 NOTE — MAU Provider Note (Signed)
  History     CSN: [id number]  Arrival date and time: 01/22/15 1327   First Provider Initiated Contact with Patient 01/22/15 1404      Chief Complaint  Patient presents with  . Hypertension   HPI [name] 49 y.o. [phone] nonpregnant female presents to [facility]U reporting that she felt faint earlier today.  She went inside and ate lunch.  This did not help and so she came here for eval.   She has been diagnosed with HTN and is supposed to be on HCTZ but has not taken this medication in several months.   OB History    Gravida Para Term Preterm AB TAB SAB Ectopic Multiple Living   2 2 2       2       Past Medical History  Diagnosis Date  . Medical history non-contributory   . Depression   . Anxiety   . Hypertension     Past Surgical History  Procedure Laterality Date  . Leep      History reviewed. No pertinent family history.  History  Substance Use Topics  . Smoking status: Never Smoker   . Smokeless tobacco: Never Used  . Alcohol Use: No    Allergies: No Known Allergies  Prescriptions prior to admission  Medication Sig Dispense Refill Last Dose  . FLUoxetine (PROZAC) 20 MG capsule Take 1 capsule (20 mg total) by mouth daily. 30 capsule 0     ROS Pertinent ROS in HPI.  All other systems are negative.   Physical Exam   Blood pressure 136/89, pulse 90, temperature 98.2 F (36.8 C), temperature source Oral, resp. rate 18, height 5\' 4"  (1.626 m), weight 212 lb 4 oz (96.276 kg), SpO2 98 %.  Physical Exam  Constitutional: She is oriented to person, place, and time. She appears well-developed and well-nourished. No distress.  HENT:  Head: Normocephalic and atraumatic.  Eyes: EOM are normal.  Neck: Normal range of motion.  Cardiovascular: Normal rate.   Respiratory: Effort normal. No respiratory distress.  Musculoskeletal: Normal range of motion.  Neurological: She is alert and oriented to person, place, and time.  Skin: Skin is warm and dry.  Psychiatric: She  has a normal mood and affect.    MAU Course  Procedures  MDM Due to quick resolution of symptoms following eating, no need for further workup  Assessment and Plan  A:  1. Near syncope      P: Discharge to home [facility] other [facility]CP [facility] for other emergency/non-ob/gyn issues  Bertram Denvereague Clark, Meldrick Buttery E 01/22/2015, 2:04 PM

## 2017-07-23 ENCOUNTER — Ambulatory Visit (HOSPITAL_COMMUNITY)
Admission: EM | Admit: 2017-07-23 | Discharge: 2017-07-23 | Disposition: A | Discharge status: Home / Self Care | Payer: Self-pay | Attending: Internal Medicine | Admitting: Internal Medicine

## 2017-07-23 ENCOUNTER — Encounter (HOSPITAL_COMMUNITY): Payer: Self-pay | Admitting: Emergency Medicine

## 2017-07-23 ENCOUNTER — Other Ambulatory Visit: Payer: Self-pay

## 2017-07-23 DIAGNOSIS — R591 Generalized enlarged lymph nodes: Secondary | ICD-10-CM

## 2017-07-23 NOTE — Discharge Instructions (Signed)
Tylenol and/or ibuprofen as needed for pain or fevers.  Push fluids to ensure adequate hydration and keep secretions thin.  Sour candies can promote salivation to help relieve symptoms. If symptoms worsen or do not improve in the next week to return to be seen or to follow up with PCP.

## 2017-07-23 NOTE — ED Triage Notes (Signed)
Pt c/o swelling in the R side of her neck since yesterday. Pt states "I think its my salivary gland infection."

## 2017-07-23 NOTE — ED Provider Notes (Signed)
[facility]    CSN: [id number] Arrival date & time: 07/23/17  1337     History   Chief Complaint Chief Complaint  Patient presents with  . Lymphadenopathy    HPI Alexis Hudson is a 52 y.o. female.   Alexis Hudson presents with complaints of swelling and tenderness which developed suddenly to the right lower jaw and to the bottom of her mouth. Denies any previous similar episodes. States she felt throbbing to the bottom of her mouth, which occurred in approximately 4 episodes for a few seconds each. These episodes have since subsided. She still feels that under her right jaw she has mild tenderness. Without fevers, without runny nose, sore throat, ear pain. Tylenol helped yesterday. Currently rates pain 1/10. No known ill contacts. history of hypertension, anxiety depression. Without rash. Patient states she is concerned because online said she could have a salivary stone.     ROS per HPI.       Past Medical History:  Diagnosis Date  . Anxiety   . Depression   . Hypertension   . Medical history non-contributory     Patient Active Problem List   Diagnosis Date Noted  . Major depressive disorder, single episode, unspecified 09/20/2013  . Depressive disorder 09/19/2013    Past Surgical History:  Procedure Laterality Date  . LEEP      OB History    Gravida Para Term Preterm AB Living   2 2 2     2    SAB TAB Ectopic Multiple Live Births                   Home Medications    Prior to Admission medications   Medication Sig Start Date End Date Taking? Authorizing Provider  Cholecalciferol (VITAMIN D PO) Take 1 capsule by mouth every other day.    [provider]    Family History No family history on file.  Social History Social History   Tobacco Use  . Smoking status: Never Smoker  . Smokeless tobacco: Never Used  Substance Use Topics  . Alcohol use: No  . Drug use: No     Allergies   Patient has no known allergies.   Review of  Systems Review of Systems   Physical Exam Triage Vital Signs ED Triage Vitals  Enc Vitals Group     BP 07/23/17 1419 (!) 147/100     Pulse Rate 07/23/17 1419 74     Resp 07/23/17 1419 18     Temp 07/23/17 1419 98.2 F (36.8 C)     Temp src --      SpO2 07/23/17 1419 100 %     Weight --      Height --      Head Circumference --      Peak Flow --      Pain Score 07/23/17 1420 1     Pain Loc --      Pain Edu? --      Excl. in GC? --    No data found.  Updated Vital Signs BP (!) 147/100   Pulse 74   Temp 98.2 F (36.8 C)   Resp 18   SpO2 100%   Visual Acuity Right Eye Distance:   Left Eye Distance:   Bilateral Distance:    Right Eye Near:   Left Eye Near:    Bilateral Near:     Physical Exam  Constitutional: She is oriented to person, place, and time. She appears  well-developed and well-nourished. No distress.  HENT:  Head: Normocephalic and atraumatic.  Right Ear: Tympanic membrane, external ear and ear canal normal.  Left Ear: Tympanic membrane, external ear and ear canal normal.  Nose: Nose normal.  Mouth/Throat: Uvula is midline, oropharynx is clear and moist and mucous membranes are normal. No tonsillar exudate.  Without swelling to parotid, without oral swelling; without facial swelling   Eyes: Conjunctivae and EOM are normal. Pupils are equal, round, and reactive to light.  Neck:  Very mild adenopathy noted; mild tenderness; equal bilaterally  Cardiovascular: Normal rate, regular rhythm and normal heart sounds.  Pulmonary/Chest: Effort normal and breath sounds normal.  Lymphadenopathy:    She has cervical adenopathy.  Neurological: She is alert and oriented to person, place, and time.  Skin: Skin is warm and dry.     UC Treatments / Results  Labs (all labs ordered are listed, but only abnormal results are displayed) Labs Reviewed - No data to display  EKG  EKG Interpretation None       Radiology No results  found.  Procedures Procedures (including critical care time)  Medications Ordered in UC Medications - No data to display   Initial Impression / Assessment and Plan / UC Course  I have reviewed the triage vital signs and the nursing notes.  Pertinent labs & imaging results that were available during my care of the patient were reviewed by me and considered in my medical decision making (see chart for details).     Without acute findings on exam. Minimal lymphadenopathy noted; without parotiditis noted; afebrile, without tachycardia. Push fluids, sour candies recommended. Continue with supportive cares. Return precautions provided. Patient verbalized understanding and agreeable to plan.    Final Clinical Impressions(s) / UC Diagnoses   Final diagnoses:  Lymphadenopathy    ED Discharge Orders    None       Controlled Substance Prescriptions [place] Controlled Substance Registry consulted? Not Applicable   Georgetta Haber, NP 07/23/17 1534

## 2018-08-10 ENCOUNTER — Encounter (HOSPITAL_COMMUNITY): Payer: Self-pay

## 2018-08-10 ENCOUNTER — Emergency Department (HOSPITAL_COMMUNITY)
Admission: EM | Admit: 2018-08-10 | Discharge: 2018-08-10 | Disposition: A | Discharge status: Home / Self Care | Payer: Self-pay | Attending: Emergency Medicine | Admitting: Emergency Medicine

## 2018-08-10 DIAGNOSIS — H1033 Unspecified acute conjunctivitis, bilateral: Secondary | ICD-10-CM | POA: Insufficient documentation

## 2018-08-10 DIAGNOSIS — I10 Essential (primary) hypertension: Secondary | ICD-10-CM | POA: Insufficient documentation

## 2018-08-10 MED ORDER — POLYMYXIN B-TRIMETHOPRIM 10000-0.1 UNIT/ML-% OP SOLN
1.0000 [drp] | OPHTHALMIC | Status: DC
  Administered 2018-08-10: 1 [drp] via OPHTHALMIC
  Filled 2018-08-10: qty 10

## 2018-08-10 NOTE — ED Provider Notes (Signed)
[facility] EMERGENCY DEPARTMENT Provider Note   CSN: [id number] Arrival date & time: 08/10/18  2214     History   Chief Complaint Chief Complaint  Patient presents with  . Eye Pain    HPI Alexis Hudson is a 53 y.o. female.  Patient presents to the emergency department with a chief complaint of eye irritation.  She states that she noticed the symptoms earlier today.  States that she has had some discharge from both of her eyes.  States that she noticed a milky/cloudy goop in her eyes.  This prompted her to come to the ER.  She denies being around anyone with the same.  She denies any vision changes.  Denies any fevers.  Denies any painful joints or dysuria.  She did try using new make-up around her eyes today.  She does not wear contacts.  The history is provided by the patient. No language interpreter was used.    Past Medical History:  Diagnosis Date  . Anxiety   . Depression   . Hypertension   . Medical history non-contributory     Patient Active Problem List   Diagnosis Date Noted  . Major depressive disorder, single episode, unspecified 09/20/2013  . Depressive disorder 09/19/2013    Past Surgical History:  Procedure Laterality Date  . LEEP       OB History    Gravida  2   Para  2   Term  2   Preterm      AB      Living  2     SAB      TAB      Ectopic      Multiple      Live Births               Home Medications    Prior to Admission medications   Medication Sig Start Date End Date Taking? Authorizing Provider  Cholecalciferol (VITAMIN D PO) Take 1 capsule by mouth every other day.    [provider]    Family History No family history on file.  Social History Social History   Tobacco Use  . Smoking status: Never Smoker  . Smokeless tobacco: Never Used  Substance Use Topics  . Alcohol use: No  . Drug use: No     Allergies   Patient has no known allergies.   Review of Systems Review of  Systems  All other systems reviewed and are negative.    Physical Exam Updated Vital Signs BP (!) 156/103   Pulse 72   Temp 98.5 F (36.9 C) (Oral)   Resp 14   Ht 5\' 4"  (1.626 m)   Wt 97.5 kg   SpO2 100%   BMI 36.90 kg/m   Physical Exam Vitals signs and nursing note reviewed.  Constitutional:      Appearance: She is well-developed.  HENT:     Head: Normocephalic and atraumatic.  Eyes:     Comments: Bilateral conjunctival erythema with mild yellow discharge Normal EOMs PERRL  Neck:     Musculoskeletal: Normal range of motion.  Cardiovascular:     Rate and Rhythm: Normal rate.  Pulmonary:     Effort: Pulmonary effort is normal.  Abdominal:     General: There is no distension.  Musculoskeletal: Normal range of motion.  Skin:    General: Skin is dry.  Neurological:     Mental Status: She is alert and oriented to person, place, and time.  Psychiatric:        Behavior: Behavior normal.        Thought Content: Thought content normal.        Judgment: Judgment normal.      ED Treatments / Results  Labs (all labs ordered are listed, but only abnormal results are displayed) Labs Reviewed - No data to display  EKG None  Radiology No results found.  Procedures Procedures (including critical care time)  Medications Ordered in ED Medications  trimethoprim-polymyxin b (POLYTRIM) ophthalmic solution 1 drop (has no administration in time range)     Initial Impression / Assessment and Plan / ED Course  I have reviewed the triage vital signs and the nursing notes.  Pertinent labs & imaging results that were available during my care of the patient were reviewed by me and considered in my medical decision making (see chart for details).    Patient with bilateral conjunctival erythema that started today.  Has had some yellow discharge.  No vision changes. Unclear source, but could be chemical conjunctivitis from her new makeup.  However, given the discharge, will  cover with polytrim drops.  Return precautions given.  Advised to follow-up with ophthalmology if no better in 2-3 days.  Final Clinical Impressions(s) / ED Diagnoses   Final diagnoses:  Acute conjunctivitis of both eyes, unspecified acute conjunctivitis type    ED Discharge Orders    None       Roxy Horseman, PA-C 08/10/18 2249    Sabas Sous, MD 08/11/18 [phone]

## 2018-08-10 NOTE — ED Notes (Signed)
Pt given prescribed eye drops and reviewed appropriated use with her. Pt had no additional questions. Departed in NAD, refused use of wheelchair.

## 2018-08-10 NOTE — ED Triage Notes (Signed)
Pt presents with bilateral eye pain and redness that started today. Pt states she thought it was the new makeup she tried today, but is unsure and would like to be evaluated. Pt endorses yellow drainage from both eyes as well. Pt denies blurred vision.

## 2018-08-10 NOTE — Discharge Instructions (Addendum)
You have been diagnosed with conjunctivitis.  This is inflammation of the conjunctiva.  It can be caused by bacteria, viruses, or chemicals.  It is possible that it is due to your new makeup.  Please stop using this.  Because it can also be caused by bacteria, we are going to treat you with antibiotic eye drops.    Used the drops as directed.  INSTILL 1 DROP IN EACH EYE EVERY 4 HOURS FOR 5 DAYS.  Please follow-up with the eye doctor listed.  Return to the ER if you experience double vision, loss of vision, flashing lights, or floating black spots.

## 2018-08-10 NOTE — ED Notes (Signed)
Patient verbalizes understanding of discharge instructions. Opportunity for questioning and answers were provided. Armband removed by staff, pt discharged from ED.  

## 2019-09-26 DIAGNOSIS — Z01419 Encounter for gynecological examination (general) (routine) without abnormal findings: Secondary | ICD-10-CM | POA: Diagnosis not present

## 2019-09-26 DIAGNOSIS — N760 Acute vaginitis: Secondary | ICD-10-CM | POA: Diagnosis not present

## 2019-09-26 DIAGNOSIS — Z6837 Body mass index (BMI) 37.0-37.9, adult: Secondary | ICD-10-CM | POA: Diagnosis not present

## 2019-09-26 DIAGNOSIS — Z1151 Encounter for screening for human papillomavirus (HPV): Secondary | ICD-10-CM | POA: Diagnosis not present

## 2019-09-26 DIAGNOSIS — Z1231 Encounter for screening mammogram for malignant neoplasm of breast: Secondary | ICD-10-CM | POA: Diagnosis not present

## 2019-09-26 DIAGNOSIS — B373 Candidiasis of vulva and vagina: Secondary | ICD-10-CM | POA: Diagnosis not present

## 2019-12-11 DIAGNOSIS — Z1211 Encounter for screening for malignant neoplasm of colon: Secondary | ICD-10-CM | POA: Diagnosis not present

## 2019-12-11 DIAGNOSIS — R140 Abdominal distension (gaseous): Secondary | ICD-10-CM | POA: Diagnosis not present

## 2019-12-11 DIAGNOSIS — Z800 Family history of malignant neoplasm of digestive organs: Secondary | ICD-10-CM | POA: Diagnosis not present

## 2020-01-01 DIAGNOSIS — Z6837 Body mass index (BMI) 37.0-37.9, adult: Secondary | ICD-10-CM | POA: Diagnosis not present

## 2020-01-01 DIAGNOSIS — I10 Essential (primary) hypertension: Secondary | ICD-10-CM | POA: Diagnosis not present

## 2020-01-01 DIAGNOSIS — Z13220 Encounter for screening for lipoid disorders: Secondary | ICD-10-CM | POA: Diagnosis not present

## 2020-01-01 DIAGNOSIS — Z23 Encounter for immunization: Secondary | ICD-10-CM | POA: Diagnosis not present

## 2020-01-01 DIAGNOSIS — Z0000 Encounter for general adult medical examination without abnormal findings: Secondary | ICD-10-CM | POA: Diagnosis not present

## 2020-01-09 DIAGNOSIS — Z800 Family history of malignant neoplasm of digestive organs: Secondary | ICD-10-CM | POA: Diagnosis not present

## 2020-01-09 DIAGNOSIS — Z1211 Encounter for screening for malignant neoplasm of colon: Secondary | ICD-10-CM | POA: Diagnosis not present

## 2020-01-09 DIAGNOSIS — D120 Benign neoplasm of cecum: Secondary | ICD-10-CM | POA: Diagnosis not present

## 2020-01-09 DIAGNOSIS — K635 Polyp of colon: Secondary | ICD-10-CM | POA: Diagnosis not present

## 2020-01-09 DIAGNOSIS — K5730 Diverticulosis of large intestine without perforation or abscess without bleeding: Secondary | ICD-10-CM | POA: Diagnosis not present

## 2020-01-14 DIAGNOSIS — I10 Essential (primary) hypertension: Secondary | ICD-10-CM | POA: Diagnosis not present

## 2020-03-05 DIAGNOSIS — I10 Essential (primary) hypertension: Secondary | ICD-10-CM | POA: Diagnosis not present

## 2021-04-20 ENCOUNTER — Other Ambulatory Visit: Payer: Self-pay | Admitting: Family Medicine

## 2021-04-20 DIAGNOSIS — Z1231 Encounter for screening mammogram for malignant neoplasm of breast: Secondary | ICD-10-CM

## 2021-04-21 ENCOUNTER — Ambulatory Visit
Admission: RE | Admit: 2021-04-21 | Discharge: 2021-04-21 | Disposition: A | Discharge status: Home / Self Care | Payer: 59 | Source: Ambulatory Visit | Attending: Family Medicine | Admitting: Family Medicine

## 2021-04-21 ENCOUNTER — Other Ambulatory Visit: Payer: Self-pay

## 2021-04-21 DIAGNOSIS — Z1231 Encounter for screening mammogram for malignant neoplasm of breast: Secondary | ICD-10-CM

## 2022-07-01 IMAGING — MG MM DIGITAL SCREENING BILAT W/ TOMO AND CAD
8 series · 8 of 24 positions shown · non-contrast
Comparison: None.

CLINICAL DATA: Screening.

EXAM:
DIGITAL SCREENING BILATERAL MAMMOGRAM WITH TOMOSYNTHESIS AND CAD
TECHNIQUE: Bilateral screening digital craniocaudal and mediolateral oblique
mammograms were obtained. Bilateral screening digital breast
tomosynthesis was performed. The images were evaluated with
computer-aided detection.

[L MLO synth-2D]
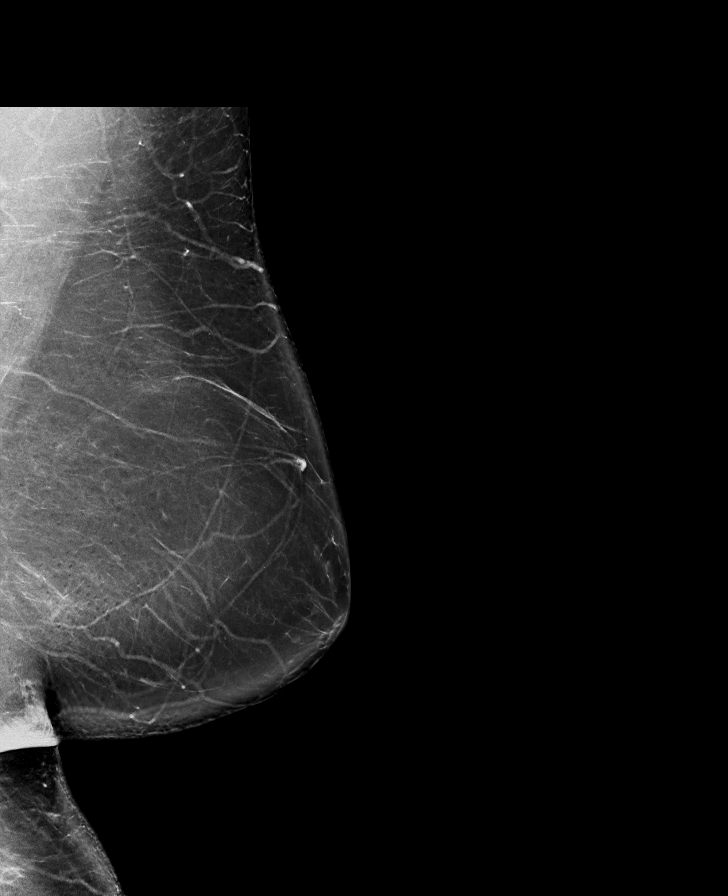

[L CC synth-2D]
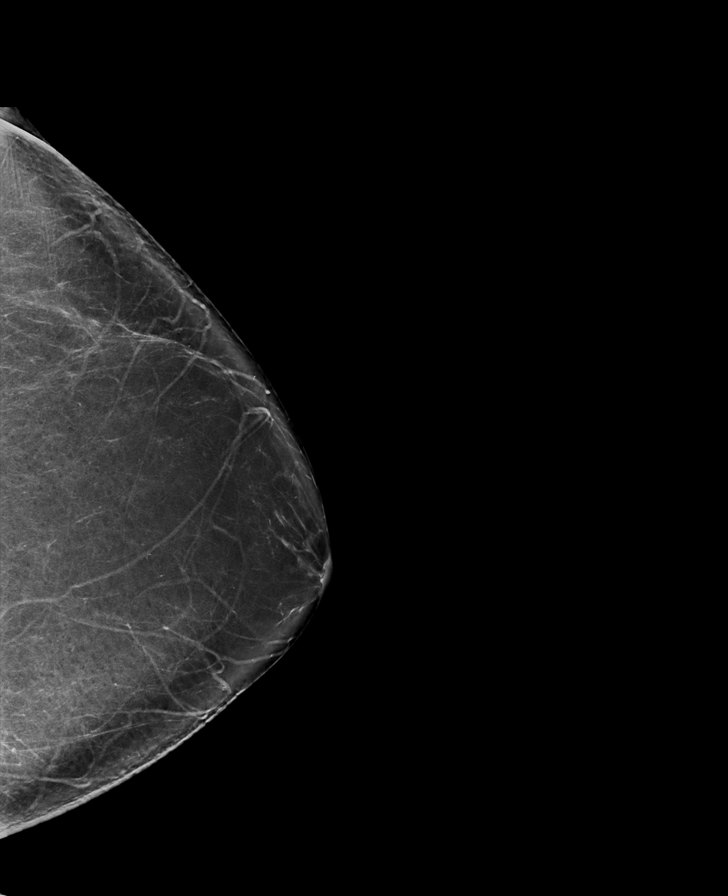

[R CC synth-2D]
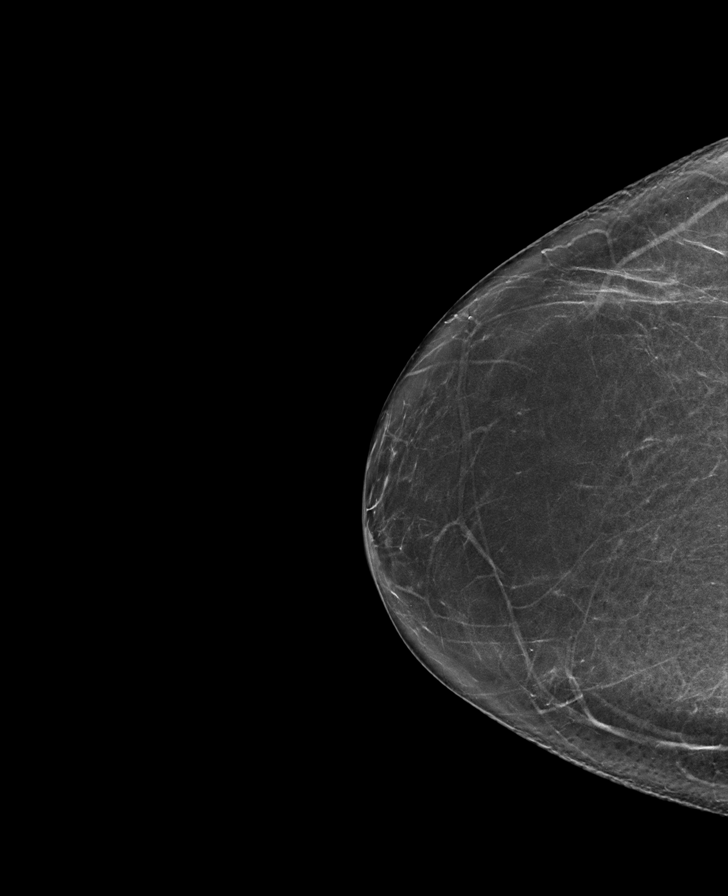

[R MLO synth-2D]
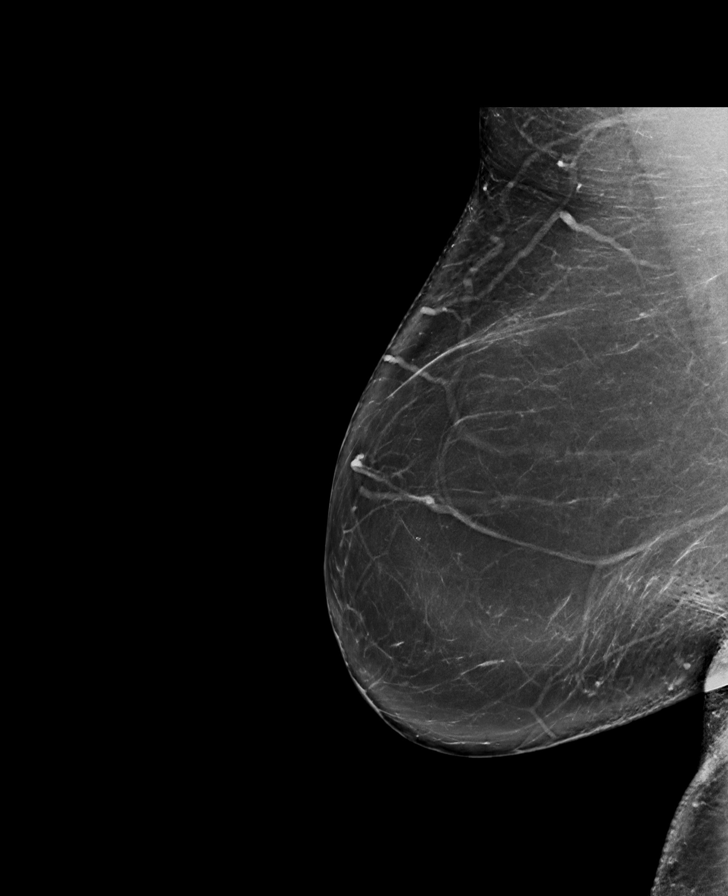

[L MLO tomo · tomo slice 47/92.0]
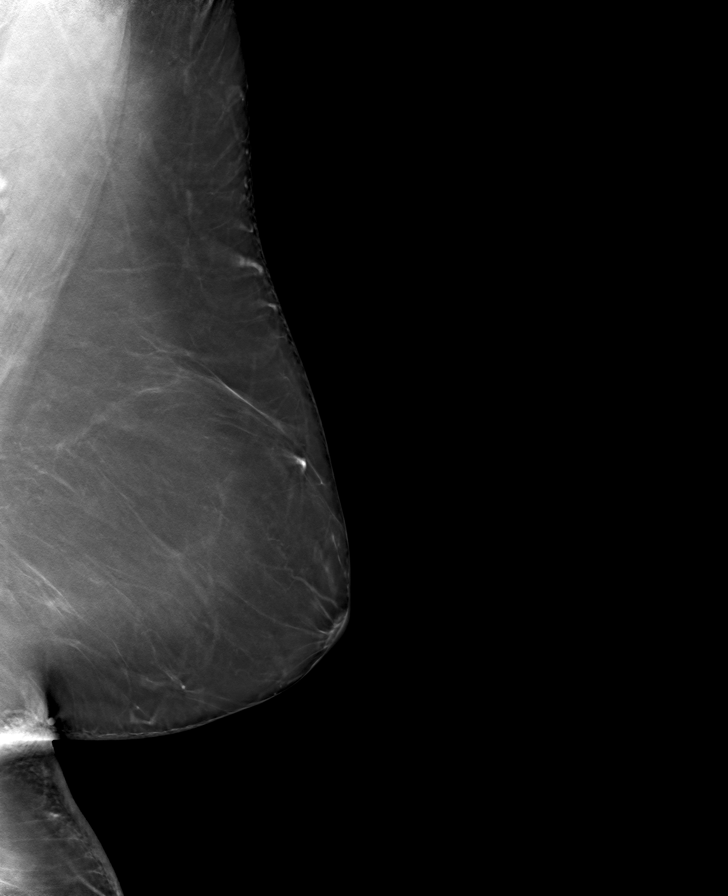

[R CC tomo · tomo slice 37/72.0]
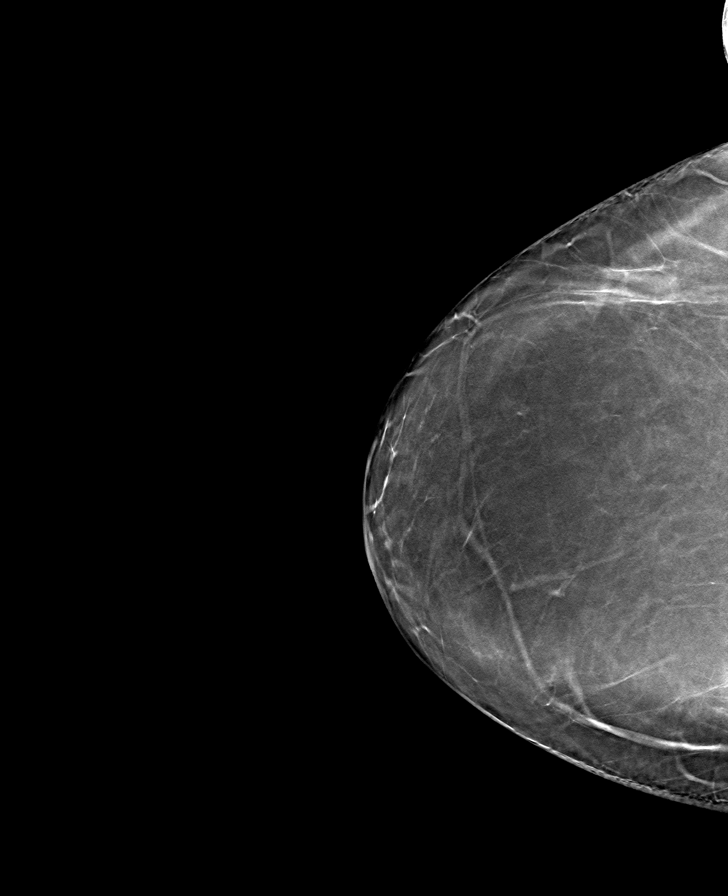

[L CC tomo · tomo slice 40/79.0]
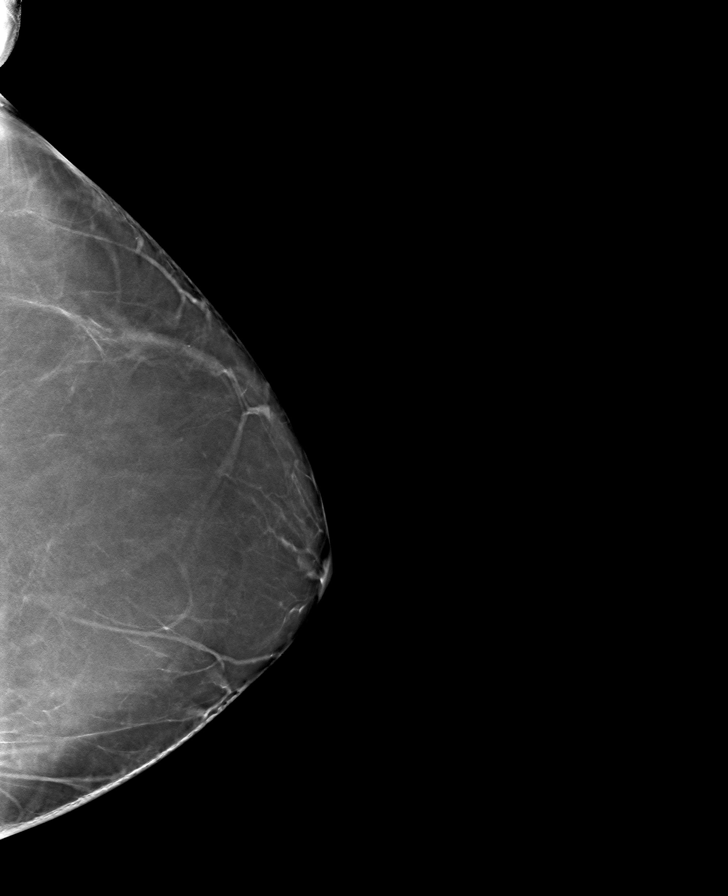

[R MLO tomo · tomo slice 48/95.0]
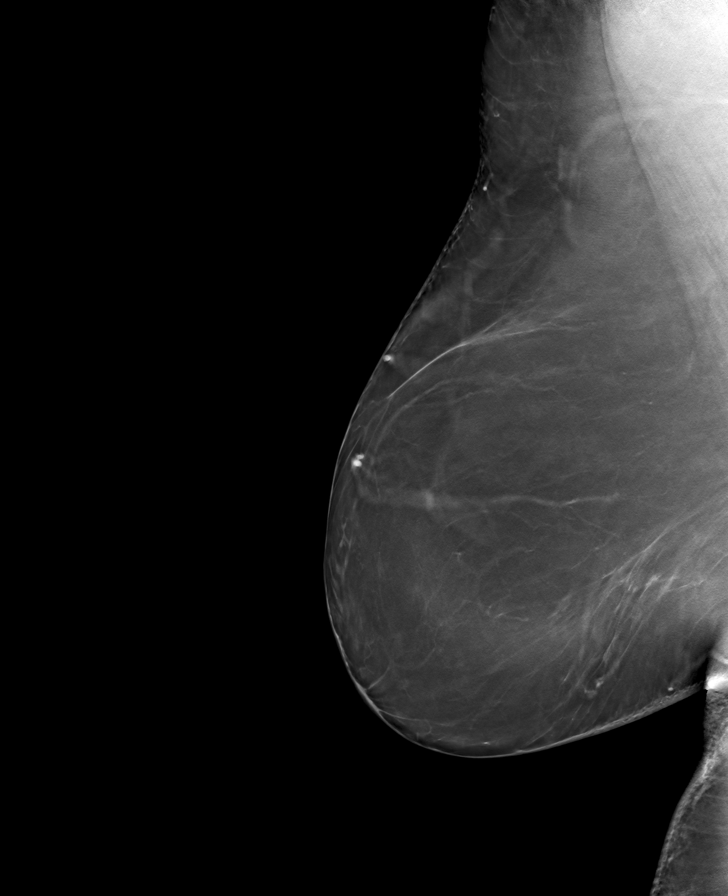

[8 of 24 positions shown; findings below may reference images not displayed]

ACR Breast Density Category b: There are scattered areas of
fibroglandular density.
FINDINGS: There are no findings suspicious for malignancy.
IMPRESSION: No mammographic evidence of malignancy. A result letter of this
screening mammogram will be mailed directly to the patient.

RECOMMENDATION:
Screening mammogram in one year. (Code:XG-X-X7B)

BI-RADS CATEGORY  1: Negative.

## 2022-11-20 DIAGNOSIS — R0789 Other chest pain: Secondary | ICD-10-CM | POA: Diagnosis not present

## 2022-11-20 DIAGNOSIS — R350 Frequency of micturition: Secondary | ICD-10-CM | POA: Diagnosis not present

## 2022-12-26 LAB — AMB RESULTS CONSOLE CBG: Glucose: 71

## 2022-12-26 NOTE — Progress Notes (Signed)
Pt has PCP and will follow up on blood pressure management.

## 2023-03-13 DIAGNOSIS — R7301 Impaired fasting glucose: Secondary | ICD-10-CM | POA: Diagnosis not present

## 2023-03-13 DIAGNOSIS — R350 Frequency of micturition: Secondary | ICD-10-CM | POA: Diagnosis not present

## 2023-03-13 DIAGNOSIS — I10 Essential (primary) hypertension: Secondary | ICD-10-CM | POA: Diagnosis not present

## 2023-03-20 DIAGNOSIS — Z86010 Personal history of colonic polyps: Secondary | ICD-10-CM | POA: Diagnosis not present

## 2023-03-20 DIAGNOSIS — Z800 Family history of malignant neoplasm of digestive organs: Secondary | ICD-10-CM | POA: Diagnosis not present

## 2023-03-20 DIAGNOSIS — Z1211 Encounter for screening for malignant neoplasm of colon: Secondary | ICD-10-CM | POA: Diagnosis not present

## 2023-03-20 DIAGNOSIS — K5730 Diverticulosis of large intestine without perforation or abscess without bleeding: Secondary | ICD-10-CM | POA: Diagnosis not present

## 2023-03-29 DIAGNOSIS — Z124 Encounter for screening for malignant neoplasm of cervix: Secondary | ICD-10-CM | POA: Diagnosis not present

## 2023-03-29 DIAGNOSIS — Z01411 Encounter for gynecological examination (general) (routine) with abnormal findings: Secondary | ICD-10-CM | POA: Diagnosis not present

## 2023-03-29 DIAGNOSIS — Z1231 Encounter for screening mammogram for malignant neoplasm of breast: Secondary | ICD-10-CM | POA: Diagnosis not present

## 2023-03-29 DIAGNOSIS — R102 Pelvic and perineal pain: Secondary | ICD-10-CM | POA: Diagnosis not present

## 2023-03-29 DIAGNOSIS — Z1331 Encounter for screening for depression: Secondary | ICD-10-CM | POA: Diagnosis not present

## 2023-04-04 DIAGNOSIS — Z23 Encounter for immunization: Secondary | ICD-10-CM | POA: Diagnosis not present

## 2023-05-22 DIAGNOSIS — J00 Acute nasopharyngitis [common cold]: Secondary | ICD-10-CM | POA: Diagnosis not present

## 2023-05-22 DIAGNOSIS — B349 Viral infection, unspecified: Secondary | ICD-10-CM | POA: Diagnosis not present

## 2023-07-19 DIAGNOSIS — N3943 Post-void dribbling: Secondary | ICD-10-CM | POA: Diagnosis not present

## 2023-08-24 DIAGNOSIS — Z860101 Personal history of adenomatous and serrated colon polyps: Secondary | ICD-10-CM | POA: Diagnosis not present

## 2023-08-24 DIAGNOSIS — Z1211 Encounter for screening for malignant neoplasm of colon: Secondary | ICD-10-CM | POA: Diagnosis not present

## 2023-08-24 DIAGNOSIS — K648 Other hemorrhoids: Secondary | ICD-10-CM | POA: Diagnosis not present

## 2023-08-24 DIAGNOSIS — K5730 Diverticulosis of large intestine without perforation or abscess without bleeding: Secondary | ICD-10-CM | POA: Diagnosis not present

## 2023-08-24 DIAGNOSIS — I10 Essential (primary) hypertension: Secondary | ICD-10-CM | POA: Diagnosis not present

## 2023-08-24 DIAGNOSIS — Z800 Family history of malignant neoplasm of digestive organs: Secondary | ICD-10-CM | POA: Diagnosis not present

## 2023-11-27 DIAGNOSIS — R350 Frequency of micturition: Secondary | ICD-10-CM | POA: Diagnosis not present

## 2023-11-27 DIAGNOSIS — N819 Female genital prolapse, unspecified: Secondary | ICD-10-CM | POA: Diagnosis not present

## 2023-11-27 DIAGNOSIS — N3281 Overactive bladder: Secondary | ICD-10-CM | POA: Diagnosis not present

## 2023-11-27 DIAGNOSIS — N3943 Post-void dribbling: Secondary | ICD-10-CM | POA: Diagnosis not present

## 2023-11-27 DIAGNOSIS — R3914 Feeling of incomplete bladder emptying: Secondary | ICD-10-CM | POA: Diagnosis not present

## 2023-12-17 DIAGNOSIS — N3943 Post-void dribbling: Secondary | ICD-10-CM | POA: Diagnosis not present

## 2023-12-17 DIAGNOSIS — R3914 Feeling of incomplete bladder emptying: Secondary | ICD-10-CM | POA: Diagnosis not present

## 2023-12-17 DIAGNOSIS — N3281 Overactive bladder: Secondary | ICD-10-CM | POA: Diagnosis not present

## 2023-12-17 DIAGNOSIS — N819 Female genital prolapse, unspecified: Secondary | ICD-10-CM | POA: Diagnosis not present

## 2023-12-17 DIAGNOSIS — R350 Frequency of micturition: Secondary | ICD-10-CM | POA: Diagnosis not present

## 2024-01-23 DIAGNOSIS — R350 Frequency of micturition: Secondary | ICD-10-CM | POA: Diagnosis not present

## 2024-01-23 DIAGNOSIS — N819 Female genital prolapse, unspecified: Secondary | ICD-10-CM | POA: Diagnosis not present

## 2024-01-23 DIAGNOSIS — N3281 Overactive bladder: Secondary | ICD-10-CM | POA: Diagnosis not present

## 2024-01-23 DIAGNOSIS — N3943 Post-void dribbling: Secondary | ICD-10-CM | POA: Diagnosis not present

## 2024-01-23 DIAGNOSIS — R3914 Feeling of incomplete bladder emptying: Secondary | ICD-10-CM | POA: Diagnosis not present
